# Patient Record
Sex: Male | Born: 1940 | Race: Asian | Hispanic: No | Marital: Married | Smoking: Former smoker
Health system: Southern US, Community
[De-identification: ages and names within clinical notes are randomized; demographics above are authoritative.]

## PROBLEM LIST (undated history)

## (undated) DIAGNOSIS — I10 Essential (primary) hypertension: Secondary | ICD-10-CM

## (undated) DIAGNOSIS — E119 Type 2 diabetes mellitus without complications: Secondary | ICD-10-CM

## (undated) HISTORY — DX: Type 2 diabetes mellitus without complications: E11.9

## (undated) HISTORY — DX: Essential (primary) hypertension: I10

---

## 2015-11-25 ENCOUNTER — Other Ambulatory Visit: Payer: Self-pay | Admitting: Internal Medicine

## 2015-12-07 ENCOUNTER — Other Ambulatory Visit: Payer: Self-pay | Admitting: General Surgery

## 2015-12-07 DIAGNOSIS — R160 Hepatomegaly, not elsewhere classified: Secondary | ICD-10-CM

## 2015-12-09 ENCOUNTER — Ambulatory Visit (HOSPITAL_COMMUNITY)
Admission: RE | Admit: 2015-12-09 | Discharge: 2015-12-09 | Disposition: A | Discharge status: Home / Self Care | Payer: BLUE CROSS/BLUE SHIELD | Source: Ambulatory Visit | Attending: General Surgery | Admitting: General Surgery

## 2015-12-09 ENCOUNTER — Ambulatory Visit (HOSPITAL_BASED_OUTPATIENT_CLINIC_OR_DEPARTMENT_OTHER): Payer: BLUE CROSS/BLUE SHIELD | Admitting: Hematology

## 2015-12-09 ENCOUNTER — Other Ambulatory Visit: Payer: Self-pay | Admitting: General Surgery

## 2015-12-09 VITALS — BP 181/77 | HR 68 | Temp 97.7°F | Resp 18 | Wt 107.6 lb

## 2015-12-09 DIAGNOSIS — R160 Hepatomegaly, not elsewhere classified: Secondary | ICD-10-CM | POA: Insufficient documentation

## 2015-12-09 DIAGNOSIS — K828 Other specified diseases of gallbladder: Secondary | ICD-10-CM | POA: Insufficient documentation

## 2015-12-09 DIAGNOSIS — R109 Unspecified abdominal pain: Secondary | ICD-10-CM | POA: Diagnosis not present

## 2015-12-09 DIAGNOSIS — R932 Abnormal findings on diagnostic imaging of liver and biliary tract: Secondary | ICD-10-CM | POA: Diagnosis not present

## 2015-12-09 DIAGNOSIS — R509 Fever, unspecified: Secondary | ICD-10-CM

## 2015-12-09 DIAGNOSIS — R938 Abnormal findings on diagnostic imaging of other specified body structures: Secondary | ICD-10-CM | POA: Diagnosis not present

## 2015-12-09 MED ORDER — GADOBENATE DIMEGLUMINE 529 MG/ML IV SOLN
10.0000 mL | Freq: Once | INTRAVENOUS | Status: AC | PRN
  Administered 2015-12-09: 10 mL via INTRAVENOUS

## 2015-12-10 ENCOUNTER — Encounter: Payer: Self-pay | Admitting: Hematology

## 2015-12-10 DIAGNOSIS — R160 Hepatomegaly, not elsewhere classified: Secondary | ICD-10-CM | POA: Insufficient documentation

## 2015-12-10 NOTE — Progress Notes (Signed)
[facility]  [phone] [phone]  Clinic New Consult Note   No care team member to display 12/10/2015   REFERRAL PHYSICIAN: Dr. Donell Beers   CHIEF COMPLAINTS/PURPOSE OF CONSULTATION:  Liver lesion on CT scan   HISTORY OF PRESENTING ILLNESS:  Jerry Proctor 75 y.o. male is here because of recently abnormal CT which showed a abnormal lesion in the left lobe of liver. He is accompanied by his daughter to the clinic.  He came from [place], has been living with his daughter in [place] for the past 4 months. He complains of intermittent epigastric discomfort and fever since 2000, when he was hospitalized at his home time in [place] and was diagnosed with gallbladder stone. He did not pursue surgery, and his symptoms resolved after a course of antibiotics. Since then, he has had intermittent epigastric pain, along with fever and chills, happens every few weeks to months, usually resolves after Tylenol and his medicine from [place] for cholelithiasis. He denies any history of liver disease, never had blood transfusion, or any surgery. He was found to have hypertension and diabetes recently, on medications. No other medical history.   He was seen by his primary care physician Dr. Wynelle Link in [facility] for his recurrent abdominal pain. CT abdomen and pelvis with contrast was obtained at [facility], which showed abnormal appearance of the left lobe of the liver suggesting hepatic or mass measuring 7.6X5.4X3.7cm, for which hepatocellular carcinoma is a consideration. Dilatation of the common bile duct with question of intrahepatic biliary obstruction. No cobblestone or other pathology was seen on the CT scan. He was referred to Dr. Donell Beers , or orders liver MRI and labs. He had MRI down before I saw him today, but unfortunately was of poor quality and the scan was done without contrast.   MEDICAL HISTORY:  1. HTN 2. Dm 3.  No history of liver disease.    SURGICAL HISTORY: History reviewed. No pertinent surgical history.  SOCIAL HISTORY: Social History   Social History  . Marital status: Married    Spouse name: N/A  . Number of children: N/A  . Years of education: N/A   Occupational History  . Not on file.   Social History Main Topics  . Smoking status: Former Smoker    Packs/day: 1.00    Years: 50.00    Quit date: 05/14/2013  . Smokeless tobacco: Never Used  . Alcohol use 1.2 - 1.8 oz/week    2 - 3 Shots of liquor per week  . Drug use: No  . Sexual activity: Not on file   Other Topics Concern  . Not on file   Social History Narrative  . 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 Currently lives with his wife, daughter and her family in [place] pulse: 90.  FAMILY HISTORY: History reviewed. No pertinent family history.  No family history of malignancy.  ALLERGIES:  has no allergies on file.  MEDICATIONS:  Current Outpatient Prescriptions  Medication Sig Dispense Refill  . lisinopril-hydrochlorothiazide (PRINZIDE,ZESTORETIC) 10-12.5 MG tablet   0  . metFORMIN (GLUCOPHAGE) 500 MG tablet   0  . ursodiol (ACTIGALL) 250 MG tablet   0   No current facility-administered medications for this visit.     REVIEW OF SYSTEMS:   Constitutional: Denies fevers, chills or abnormal night sweats Eyes: Denies blurriness of vision, double vision or watery eyes Ears, nose, mouth, throat, and face: Denies mucositis or sore throat Respiratory:  Denies cough, dyspnea or wheezes Cardiovascular: Denies palpitation, chest discomfort or lower extremity swelling Gastrointestinal:  Denies nausea, heartburn or change in bowel habits Skin: Denies abnormal skin rashes Lymphatics: Denies new lymphadenopathy or easy bruising Neurological:Denies numbness, tingling or new weaknesses Behavioral/Psych: Mood is stable, no new changes  All other systems were reviewed with the patient and are negative.  PHYSICAL EXAMINATION: ECOG  PERFORMANCE STATUS: 0 - Asymptomatic  Vitals:   12/09/15 1546  BP: (!) 181/77  Pulse: 68  Resp: 18  Temp: 97.7 F (36.5 C)   Filed Weights   12/09/15 1546  Weight: 107 lb 9.6 oz (48.8 kg)    GENERAL:alert, no distress and comfortable SKIN: skin color, texture, turgor are normal, no rashes or significant lesions EYES: normal, conjunctiva are pink and non-injected, sclera clear OROPHARYNX:no exudate, no erythema and lips, buccal mucosa, and tongue normal  NECK: supple, thyroid normal size, non-tender, without nodularity LYMPH:  no palpable lymphadenopathy in the cervical, axillary or inguinal LUNGS: clear to auscultation and percussion with normal breathing effort HEART: regular rate & rhythm and no murmurs and no lower extremity edema ABDOMEN:abdomen soft, non-tender and normal bowel sounds, no organomegaly, mild tenderness in the epigastric area. Musculoskeletal:no cyanosis of digits and no clubbing  PSYCH: alert & oriented x 3 with fluent speech NEURO: no focal motor/sensory deficits  LABORATORY DATA:  I have reviewed the data as listed  His out side lab on 12/05/2015 CBC: WBC 4.9, hemoglobin 14.2, hematocrit 42.7%, MCV 93.2, platelet 199K CMP: Within normal limits, creatinine 0.93, AST 15, ALT 9, total bilirubin 1.0, albumin 4.1, PT 10.4, INR 1.0 AFP 4.8ng/ml  RADIOGRAPHIC STUDIES: I have personally reviewed the radiological images as listed and agreed with the findings in the report. Mr Abdomen W Wo Contrast  Result Date: 12/09/2015 CLINICAL DATA:  Lung mass. EXAM: MRI ABDOMEN WITHOUT AND WITH CONTRAST TECHNIQUE: Multiplanar multisequence MR imaging of the abdomen was performed both before and after the administration of intravenous contrast. CONTRAST:  10mL MULTIHANCE GADOBENATE DIMEGLUMINE 529 MG/ML IV SOLN COMPARISON:  No previous imaging for comparison. Outside CT report from [facility] documents "Question of a mass containing area of calcification and  dilated blood vessels in the left lobe of the liver measuring approximately 7.6 x 5.4 x 3.7 cm. Following contrast infusion there is probable minimal heterogeneous enhancement." Perfect. I did thinning and canal so the final FINDINGS: Lower chest:  Unremarkable. Hepatobiliary: The lateral segment left liver is markedly atrophied with marked intrahepatic dilatation of the ducts in the lateral segment left liver. These changes are associated with what appears to be a branching structure that is hypo intense on both T2 and precontrast T1 weighted imaging. See image 11 of series 6 and images 11 through 23 of series 404. No abnormal diffusion is readily evident in this region. After IV contrast administration, no early arterial phase/ hypervascular enhancement is identified and no late or delayed phase enhancement is appreciated. Although the right portal vein is well demonstrated, the left portal vein cannot be identified. There is mild intrahepatic biliary duct dilatation in the right liver and the extrahepatic common duct measures up to 13 mm diameter. Common bile duct in the head of the pancreas is 7 mm in the ampulla it appears to be in or in very close proximity to a large duodenum diverticulum. The gallbladder is distended without evidence of gallstones Pancreas: No evidence for focal pancreatic mass lesion. There is no dilatation of the main pancreatic duct. Spleen: Unremarkable. Adrenals/Urinary  Tract: No adrenal nodule or mass. Tiny cyst noted upper pole right kidney. No enhancing lesion in either kidney. Stomach/Bowel: Stomach is nondistended. No gastric wall thickening. No evidence of outlet obstruction. Duodenum is normally positioned as is the ligament of Treitz. The visualized small bowel loops and colonic segments of the abdomen are unremarkable. Vascular/Lymphatic: No abdominal aortic aneurysm. The main portal vein and right portal vein are patent. The hepatic veins appear patent. There is no evidence  for venous collateralization in the upper abdomen. Other: No intraperitoneal free fluid. Musculoskeletal: No abnormal marrow signal within the visualized bony anatomy. IMPRESSION: 1. Markedly atrophic lateral segment left liver with associated dilatation of the intrahepatic bile ducts in the lateral segment left liver. These changes are associated with what appears to be a branching, nonenhancing structure within the liver parenchyma that is difficult to measure any meaningful way giving the branching configuration. Given that no enhancing left portal vein can be identified, imaging featuresto be most likely related to portal vein thrombosis with chronic atrophy of the lateral segment left liver. There is no abnormal diffusion or hypervascularity to suggest hepatoma as etiology. There is no evidence of delayed enhancement to suggest cholangiocarcinoma as the etiology for the portal vein thrombosis although small cholangiocarcinoma is not always visible by MRI. The CT report documents calcification in this region which would suggest an element of chronicity as does the parenchymal atrophy and marked diffuse biliary dilatation in the setting of normal LFTs. Direct comparison to the CT images would be extremely helpful to more definitively characterize the abnormality in the lateral segment of the left liver. 2. Although this study is degraded by motion, no definite findings for cirrhosis. No findings on today's exam to suggest portal venous hypertension. 3. Dilated gallbladder without evidence of gallstones. The common bile duct is mildly distended down to the level of the ampulla which is either incorporated into or in very close proximity to a large duodenal diverticulum that may be generating some mass-effect on the distal duct/ampulla. I discussed this case by telephone with Dr. Mosetta Putt at the time of study interpretation. If the above findings do represent left portal vein thrombosis, no etiology is apparent by MRI  today. By report, the patient has a normal AFP and there is a low clinical index of suspicion for neoplasm as the patient has no history of weight loss, no significant risk factor for hepatitis/cirrhosis and liver function tests are normal at this time. The patient's daughter will obtain CT images from the outside facility to make available for direct comparison, after which further characterization may be possible and follow-up imaging recommendation will be more meaningful. Electronically Signed   By: Kennith Center M.D.   On: 12/09/2015 19:13   ASSESSMENT & PLAN: 75 year old [place] male, presented with recurrent epigastric pain, fever and chills. CT scan of abdomen and pelvis showed abnormal appearance of the left lobe of liver, concerning for a large mass.  1. Liver lesion on CT scan  -After his visit with me, I accompanied him to complete the liver MRI w contrast  -His MRI w/wo contrast was reviewed by Dr. Molli Posey and he discussed the result with me over the phone -MRI did not show any discrete mass or any lesions with enhancement. 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 No significant imaging findings of liver cirrhosis. No biliary stone.  -Clinically he has no history of liver disease, except moderate  alcohol history. His liver function and AFP are normal. No high risk for [facility]  -We will obtain his CT scan images from outside for comparison purposes. -No further workup is needed at this point. He is returning to [place] tomorrow.  I recommend him to have a follow-up CT or MRI of abdomen in 3-6 month.   2. Intermittent abdominal pain and fever  -unclear etiology, he seems responding to cholelithiasis treatment, no stones incompatible common bile duct seen and image. -I recommend him to follow-up with GI, and obtain a EGD for further evaluation.  Follow up: I will see him as needed in the future. If  he is going to return to the [place], I'll recommend him to have a repeat CT or MRI of abdomen in 3-6 months, and follow-up with GI.  Orders Placed This Encounter  Procedures  . MR Abdomen W Wo Contrast    Standing Status:   Future    Standing Expiration Date:   02/08/2017    Order Specific Question:   If indicated for the ordered procedure, I authorize the administration of contrast media per Radiology protocol    Answer:   Yes    Order Specific Question:   Reason for Exam (SYMPTOM  OR DIAGNOSIS REQUIRED)    Answer:   abnormal CT, ? liver mass    Order Specific Question:   Preferred imaging location?    Answer:   [facility] (table limit-350 lbs)    Order Specific Question:   What is the patient's sedation requirement?    Answer:   No Sedation    Order Specific Question:   Does the patient have a pacemaker or implanted devices?    Answer:   No    All questions were answered. The patient knows to call the clinic with any problems, questions or concerns. I spent 45 minutes counseling the patient face to face. The total time spent in the appointment was 60 minutes and more than 50% was on counseling.     Malachy Mood, MD 12/09/2015

## 2015-12-19 ENCOUNTER — Ambulatory Visit: Payer: BLUE CROSS/BLUE SHIELD | Admitting: Hematology

## 2015-12-29 ENCOUNTER — Telehealth: Payer: Self-pay | Admitting: *Deleted

## 2015-12-29 NOTE — Telephone Encounter (Signed)
[facility] stating that daughter Paulene FloorXiu [place] states that [facility] & they should be sending us results.  Call lback # is [phone].  Message to Dr Mosetta PuttFeng.

## 2015-12-30 ENCOUNTER — Telehealth: Payer: Self-pay | Admitting: *Deleted

## 2015-12-30 NOTE — Telephone Encounter (Signed)
[facility] @ [phone] & requested CT report done in July.

## 2016-01-02 ENCOUNTER — Telehealth: Payer: Self-pay | Admitting: *Deleted

## 2016-01-02 NOTE — Telephone Encounter (Signed)
Called [facility] and left message with CT dept. Re:  Requested  CD of CT  A/P to be sent to our office to be uploaded by [facility]. [facility]     Phone     [phone]       ;     Fax     [phone].

## 2016-01-06 ENCOUNTER — Inpatient Hospital Stay
Admission: RE | Admit: 2016-01-06 | Discharge: 2016-01-06 | Disposition: A | Discharge status: Home / Self Care | Payer: Self-pay | Source: Ambulatory Visit | Attending: Hematology | Admitting: Hematology

## 2016-01-06 ENCOUNTER — Other Ambulatory Visit: Payer: Self-pay | Admitting: Hematology

## 2016-01-06 DIAGNOSIS — R160 Hepatomegaly, not elsewhere classified: Secondary | ICD-10-CM

## 2018-01-25 IMAGING — US US OUTSIDE FILMS BODY
1 series · 16 of 16 positions shown · non-contrast
Comparison: none

[Series 3: us outside films body · 16 of 58 slices shown]
[im 1/58]
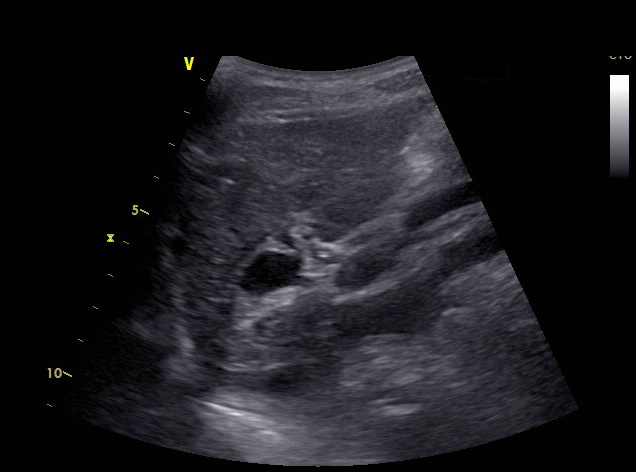
[im 4/58]
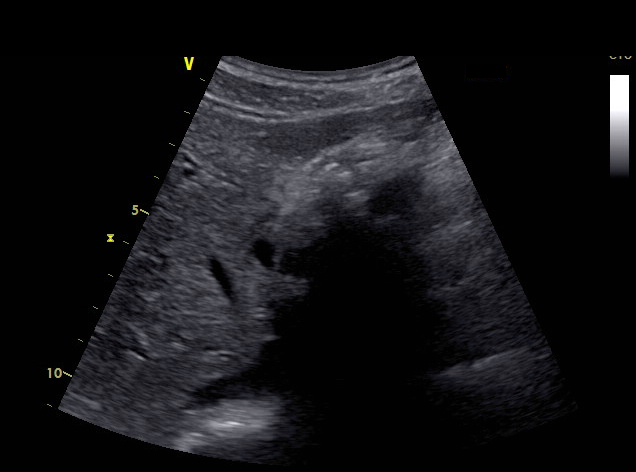
[im 8/58]
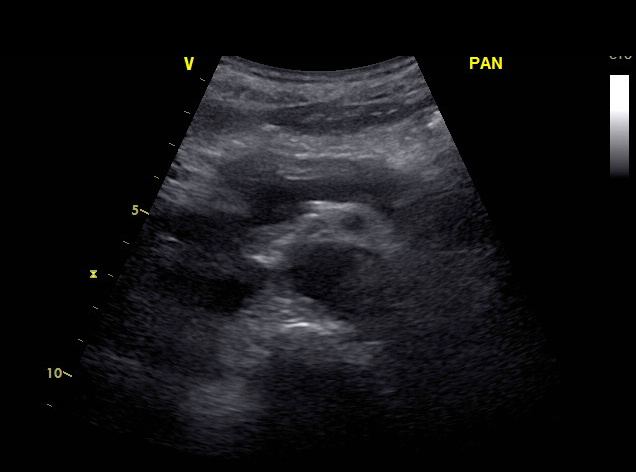
[im 12/58]
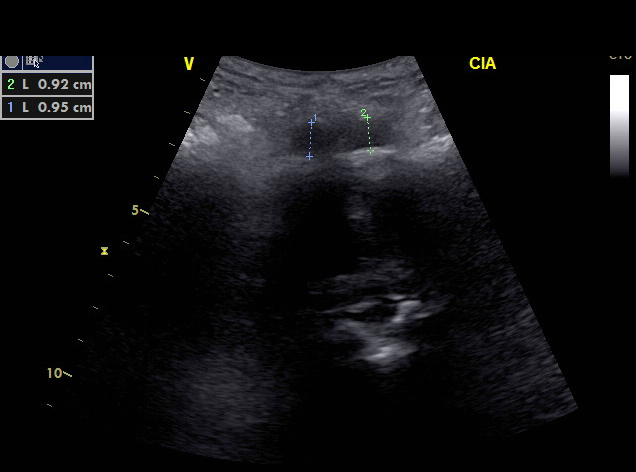
[im 16/58]
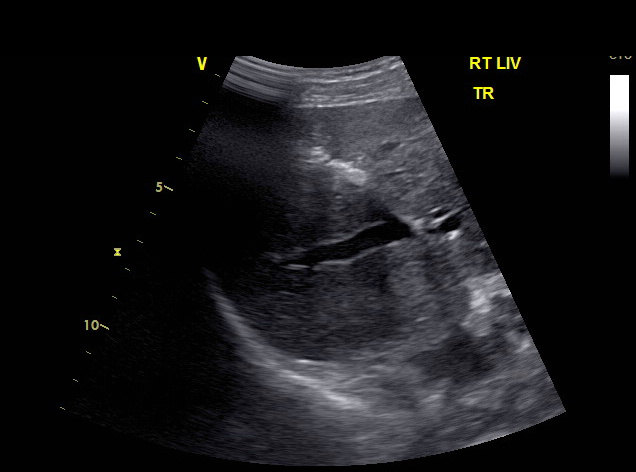
[im 20/58]
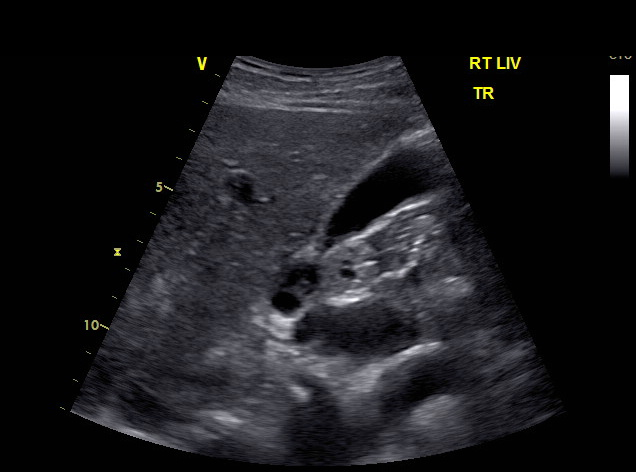
[im 23/58]
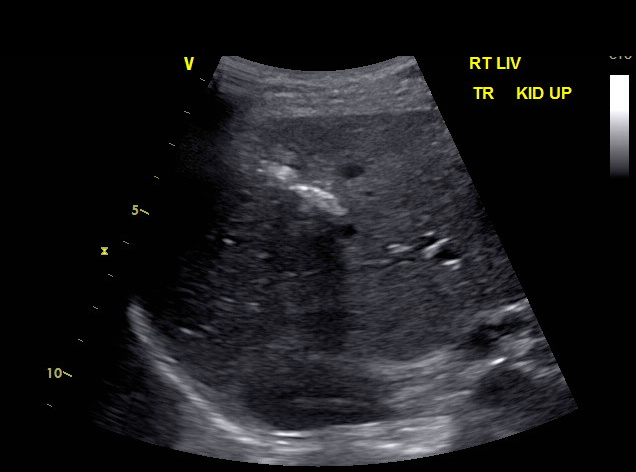
[im 27/58]
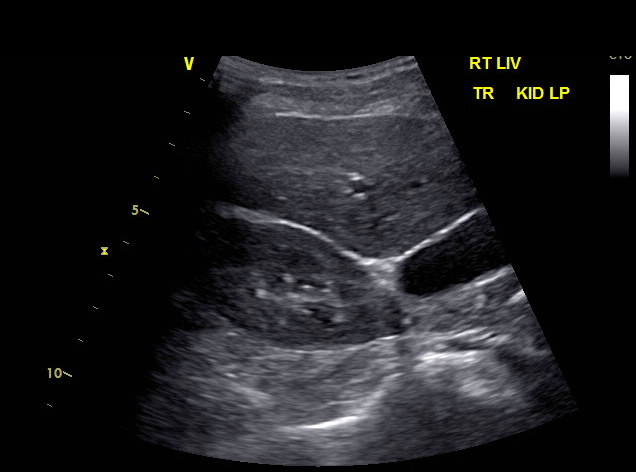
[im 31/58]
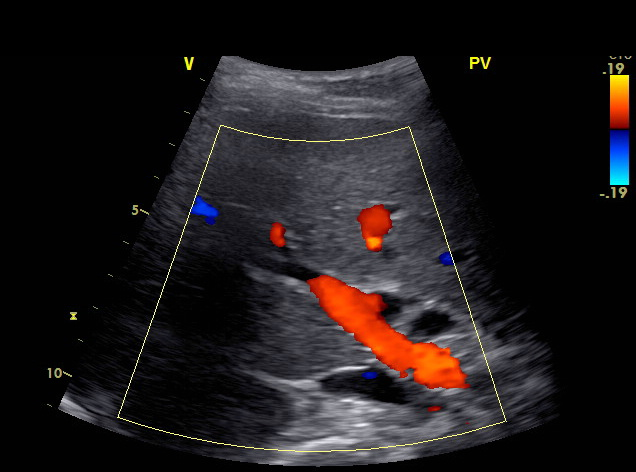
[im 35/58]
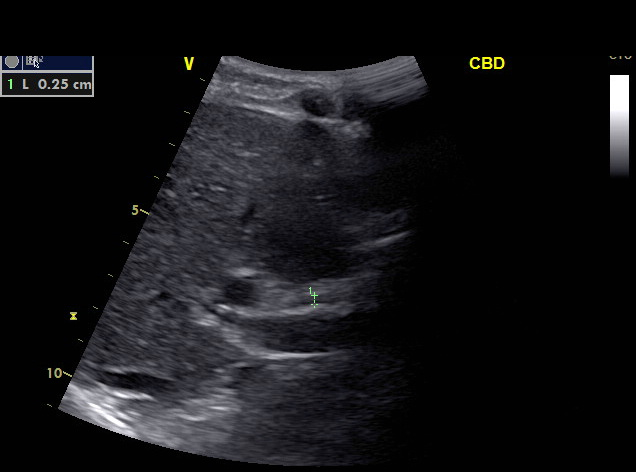
[im 39/58]
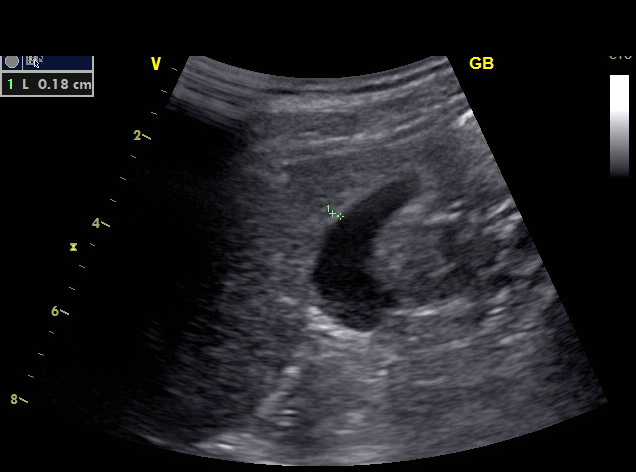
[im 42/58]
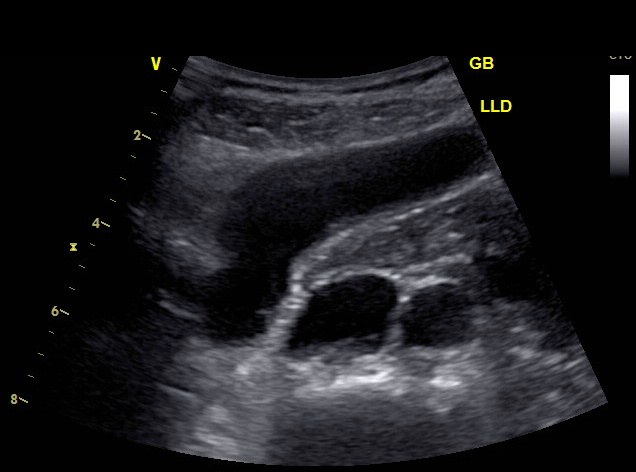
[im 46/58]
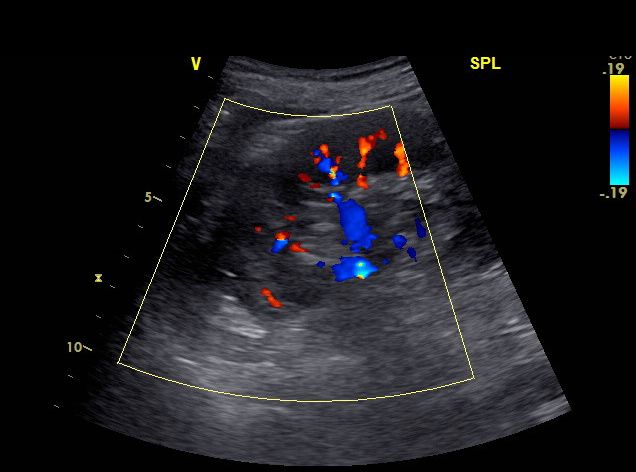
[im 50/58]
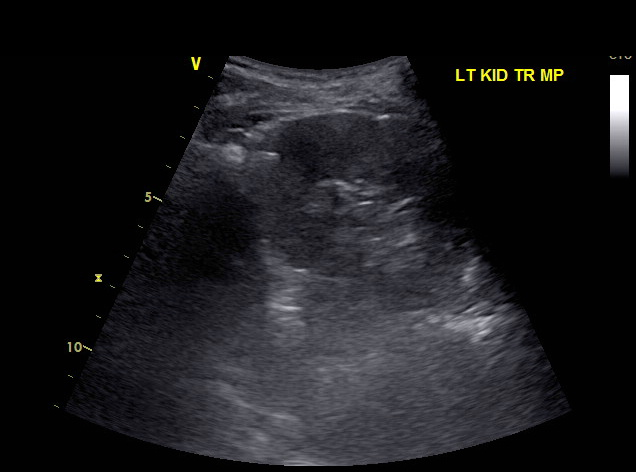
[im 54/58]
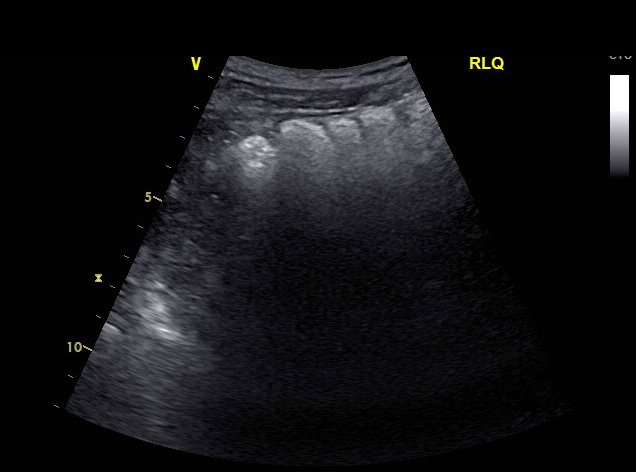
[im 58/58]
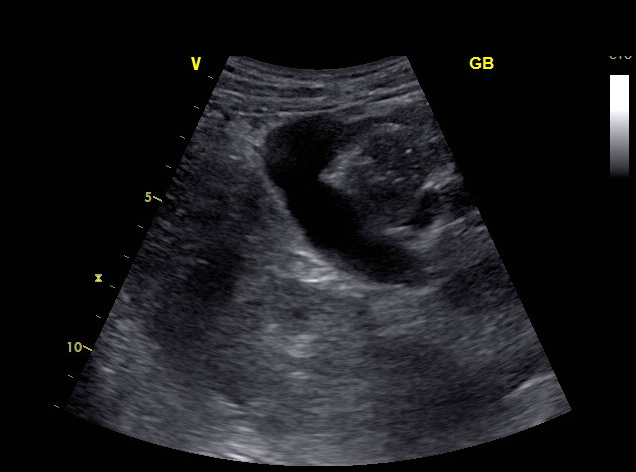

[16 of 16 positions shown; findings below may reference images not displayed]

Canned report from images found in remote index.

Refer to host system for actual result text.
